# Patient Record
Sex: Male | Born: 1995 | Race: White | Hispanic: No | Marital: Single | Smoking: Current every day smoker
Health system: Southern US, Community
[De-identification: ages and names within clinical notes are randomized; demographics above are authoritative.]

---

## 2018-07-20 ENCOUNTER — Emergency Department (HOSPITAL_COMMUNITY)
Admission: EM | Admit: 2018-07-20 | Discharge: 2018-07-20 | Disposition: A | Discharge status: Home / Self Care | Payer: No Typology Code available for payment source | Attending: Emergency Medicine | Admitting: Emergency Medicine

## 2018-07-20 ENCOUNTER — Encounter (HOSPITAL_COMMUNITY): Payer: Self-pay | Admitting: Emergency Medicine

## 2018-07-20 ENCOUNTER — Other Ambulatory Visit: Payer: Self-pay

## 2018-07-20 ENCOUNTER — Emergency Department (HOSPITAL_COMMUNITY): Payer: No Typology Code available for payment source

## 2018-07-20 DIAGNOSIS — T148XXA Other injury of unspecified body region, initial encounter: Secondary | ICD-10-CM

## 2018-07-20 DIAGNOSIS — F17200 Nicotine dependence, unspecified, uncomplicated: Secondary | ICD-10-CM | POA: Insufficient documentation

## 2018-07-20 DIAGNOSIS — S92001A Unspecified fracture of right calcaneus, initial encounter for closed fracture: Secondary | ICD-10-CM | POA: Diagnosis not present

## 2018-07-20 DIAGNOSIS — Y939 Activity, unspecified: Secondary | ICD-10-CM | POA: Diagnosis not present

## 2018-07-20 DIAGNOSIS — Y92410 Unspecified street and highway as the place of occurrence of the external cause: Secondary | ICD-10-CM | POA: Insufficient documentation

## 2018-07-20 DIAGNOSIS — Y998 Other external cause status: Secondary | ICD-10-CM | POA: Insufficient documentation

## 2018-07-20 DIAGNOSIS — Z23 Encounter for immunization: Secondary | ICD-10-CM | POA: Insufficient documentation

## 2018-07-20 DIAGNOSIS — S99911A Unspecified injury of right ankle, initial encounter: Secondary | ICD-10-CM | POA: Diagnosis present

## 2018-07-20 LAB — COMPREHENSIVE METABOLIC PANEL
ALT: 21 U/L (ref 0–44)
AST: 30 U/L (ref 15–41)
Albumin: 4.7 g/dL (ref 3.5–5.0)
Alkaline Phosphatase: 81 U/L (ref 38–126)
Anion gap: 10 (ref 5–15)
BUN: 15 mg/dL (ref 6–20)
CO2: 28 mmol/L (ref 22–32)
Calcium: 9.9 mg/dL (ref 8.9–10.3)
Chloride: 100 mmol/L (ref 98–111)
Creatinine, Ser: 0.89 mg/dL (ref 0.61–1.24)
GFR calc Af Amer: >60 mL/min (ref >60)
GFR calc non Af Amer: >60 mL/min (ref >60)
Glucose, Bld: 140 mg/dL — ABNORMAL HIGH (ref 70–99)
Potassium: 3.5 mmol/L (ref 3.5–5.1)
Sodium: 138 mmol/L (ref 135–145)
Total Bilirubin: 0.5 mg/dL (ref 0.3–1.2)
Total Protein: 7.7 g/dL (ref 6.5–8.1)

## 2018-07-20 LAB — URINALYSIS, ROUTINE W REFLEX MICROSCOPIC
Bilirubin Urine: NEGATIVE
Glucose, UA: NEGATIVE mg/dL
Hgb urine dipstick: NEGATIVE
Ketones, ur: 5 mg/dL — AB
Leukocytes,Ua: NEGATIVE
Nitrite: NEGATIVE
Protein, ur: NEGATIVE mg/dL
Specific Gravity, Urine: 1.024 (ref 1.005–1.030)
pH: 7 (ref 5.0–8.0)

## 2018-07-20 LAB — CBC
HCT: 43.8 % (ref 39.0–52.0)
Hemoglobin: 15.3 g/dL (ref 13.0–17.0)
MCH: 31 pg (ref 26.0–34.0)
MCHC: 34.9 g/dL (ref 30.0–36.0)
MCV: 88.8 fL (ref 80.0–100.0)
Platelets: 355 10*3/uL (ref 150–400)
RBC: 4.93 MIL/uL (ref 4.22–5.81)
RDW: 12.1 % (ref 11.5–15.5)
WBC: 23.9 10*3/uL — ABNORMAL HIGH (ref 4.0–10.5)
nRBC: 0 % (ref 0.0–0.2)

## 2018-07-20 LAB — ETHANOL: Alcohol, Ethyl (B): <10 mg/dL (ref <10)

## 2018-07-20 LAB — RAPID URINE DRUG SCREEN, HOSP PERFORMED
Amphetamines: POSITIVE — AB
Barbiturates: NOT DETECTED
Benzodiazepines: NOT DETECTED
Cocaine: POSITIVE — AB
Opiates: POSITIVE — AB
Tetrahydrocannabinol: POSITIVE — AB

## 2018-07-20 LAB — SAMPLE TO BLOOD BANK

## 2018-07-20 LAB — I-STAT CREATININE, ED: Creatinine, Ser: 0.7 mg/dL (ref 0.61–1.24)

## 2018-07-20 MED ORDER — OXYCODONE-ACETAMINOPHEN 5-325 MG PO TABS
1.0000 | ORAL_TABLET | Freq: Once | ORAL | Status: AC
  Administered 2018-07-20: 1 via ORAL
  Filled 2018-07-20: qty 1

## 2018-07-20 MED ORDER — MORPHINE SULFATE (PF) 4 MG/ML IV SOLN
4.0000 mg | Freq: Once | INTRAVENOUS | Status: AC
  Administered 2018-07-20: 4 mg via INTRAVENOUS
  Filled 2018-07-20: qty 1

## 2018-07-20 MED ORDER — NICOTINE 21 MG/24HR TD PT24
21.0000 mg | MEDICATED_PATCH | Freq: Once | TRANSDERMAL | Status: DC
  Administered 2018-07-20: 21 mg via TRANSDERMAL
  Filled 2018-07-20: qty 1

## 2018-07-20 MED ORDER — TETANUS-DIPHTH-ACELL PERTUSSIS 5-2.5-18.5 LF-MCG/0.5 IM SUSP
0.5000 mL | Freq: Once | INTRAMUSCULAR | Status: AC
  Administered 2018-07-20: 0.5 mL via INTRAMUSCULAR
  Filled 2018-07-20: qty 0.5

## 2018-07-20 MED ORDER — IOHEXOL 300 MG/ML  SOLN
100.0000 mL | Freq: Once | INTRAMUSCULAR | Status: AC | PRN
  Administered 2018-07-20: 100 mL via INTRAVENOUS

## 2018-07-20 MED ORDER — OXYCODONE-ACETAMINOPHEN 5-325 MG PO TABS: 1.0000 | ORAL_TABLET | Freq: Four times a day (QID) | ORAL | 0 refills | Status: AC | PRN

## 2018-07-20 NOTE — ED Notes (Signed)
C-collar being removed by EDP.
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
## 2018-07-20 NOTE — ED Notes (Signed)
ED Provider at bedside. 

## 2018-07-20 NOTE — ED Notes (Signed)
Pt returned from CT. Currently in the supine position

## 2018-07-20 NOTE — Progress Notes (Signed)
Orthopedic Tech Progress Note Patient Details:  Corey Greene Jun 18, 1995 [id number]  Ortho Devices Type of Ortho Device: Stirrup splint, Other (comment), Lenora Boys splint Ortho Device/Splint Location: rle. extra bulky dressing with posterior short leg and stirrup splints. Pt was moving around alot during applicatin. RN assisted in holding leg. Ortho Device/Splint Interventions: Ordered, Application   Post Interventions Patient Tolerated: Fair Instructions Provided: Care of device, Adjustment of device   [name] 07/20/2018, 8:49 PM

## 2018-07-20 NOTE — Progress Notes (Signed)
Orthopedic Tech Progress Note Patient Details:  Corey Greene Mar 22, 1996 [id number]  Ortho Devices Type of Ortho Device: Crutches Ortho Device/Splint Location: rle. extra bulky dressing with posterior short leg and stirrup splints. Pt was moving around alot during applicatin. RN assisted in holding leg. Ortho Device/Splint Interventions: Ordered, Application, Adjustment   Post Interventions Patient Tolerated: Well Instructions Provided: Care of device, Adjustment of device   [name] 07/20/2018, 10:08 PM

## 2018-07-20 NOTE — Discharge Instructions (Addendum)
You need to keep your foot elevated.  Keeping your toes above your nose will help significantly with your pain and help you heal.  If you do not do this you will have problems healing.  As we also discussed smoking makes your bone heal slower so please try to cut back significantly or quit smoking.  Do not put any weight on your right leg.  Please take Ibuprofen (Advil, motrin) and Tylenol (acetaminophen) to relieve your pain.  You may take up to 600 MG (3 pills) of normal strength ibuprofen every 8 hours as needed.  In between doses of ibuprofen you make take tylenol, up to 1,000 mg (two extra strength pills).  Do not take more than 3,000 mg tylenol in a 24 hour period.  Please check all medication labels as many medications such as pain and cold medications may contain tylenol.  Do not drink alcohol while taking these medications.  Do not take other NSAID'S while taking ibuprofen (such as aleve or naproxen).  Please take ibuprofen with food to decrease stomach upset.  Today you received medications that may make you sleepy or impair your ability to make decisions.  For the next 24 hours please do not drive, operate heavy machinery, care for a small child with out another adult present, or perform any activities that may cause harm to you or someone else if you were to fall asleep or be impaired.  Do not use alcohol, or take any recreational drugs while you are taking this medicine.  If you mix this medicine with recreational drugs, or take too much of this medicine you can have serious complications including death.  You are being prescribed a medication which may make you sleepy. Please follow up of listed precautions for at least 24 hours after taking one dose.

## 2018-07-20 NOTE — ED Notes (Signed)
Patient transported to CT 
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
